# Patient Record
Sex: Female | Born: 1950 | Race: White | Hispanic: No | Marital: Married | Smoking: Never smoker
Health system: Southern US, Community
[De-identification: ages and names within clinical notes are randomized; demographics above are authoritative.]

## PROBLEM LIST (undated history)

## (undated) DIAGNOSIS — I10 Essential (primary) hypertension: Secondary | ICD-10-CM

## (undated) DIAGNOSIS — I4891 Unspecified atrial fibrillation: Secondary | ICD-10-CM

## (undated) DIAGNOSIS — I509 Heart failure, unspecified: Secondary | ICD-10-CM

## (undated) HISTORY — DX: Unspecified atrial fibrillation: I48.91

---

## 2020-02-07 ENCOUNTER — Ambulatory Visit (INDEPENDENT_AMBULATORY_CARE_PROVIDER_SITE_OTHER): Payer: Medicare Other | Admitting: Nurse Practitioner

## 2020-02-07 ENCOUNTER — Other Ambulatory Visit: Payer: Self-pay

## 2020-02-07 ENCOUNTER — Encounter: Payer: Self-pay | Admitting: Nurse Practitioner

## 2020-02-07 DIAGNOSIS — K219 Gastro-esophageal reflux disease without esophagitis: Secondary | ICD-10-CM

## 2020-02-07 DIAGNOSIS — R935 Abnormal findings on diagnostic imaging of other abdominal regions, including retroperitoneum: Secondary | ICD-10-CM

## 2020-02-07 DIAGNOSIS — R1010 Upper abdominal pain, unspecified: Secondary | ICD-10-CM | POA: Diagnosis not present

## 2020-02-07 DIAGNOSIS — R109 Unspecified abdominal pain: Secondary | ICD-10-CM | POA: Insufficient documentation

## 2020-02-07 MED ORDER — PANTOPRAZOLE SODIUM 40 MG PO TBEC: 40.0000 mg | DELAYED_RELEASE_TABLET | Freq: Two times a day (BID) | ORAL | 3 refills | Status: DC

## 2020-02-07 NOTE — Patient Instructions (Signed)
Your health issues we discussed today were:   GERD (reflux/heartburn): 1. I am sorry you are still having the burning in your esophagus feeling 2. Stop taking omeprazole 3. I sent a prescription to your pharmacy for Protonix (pantoprazole) 40 mg twice daily 4. Take this pursing in the morning on an empty stomach and 30 minutes for your last meal the day 5. Let us know if you do not have any improvement on the new medication  Abdominal pain: 1. I feel part of your abdominal symptoms are likely coming from your GERD 2. I will check a HIDA scan to check for gallbladder function to see if "biliary dyskinesia" is causing some of your symptoms 3. Call us if you have any worsening or severe symptoms 4. Somebody from our office or any [facility] radiology will call to schedule your HIDA scan  Previous ultrasound with some abnormal findings: 1. Have your labs checked when you are able to 2. I will put in for an ultrasound of your liver with "elastography" which checks for any scarring of your liver 3. Further recommendations will follow your results  Overall I recommend:  1. We will request your previous records from [facility] 2. Continue your other current medications 3. Return for follow-up in 2 months 4. Call us if you have any questions or concerns   ---------------------------------------------------------------  I am glad you have gotten your COVID-19 vaccination!  Even though you are fully vaccinated you should continue to follow CDC and state/local guidelines.  ---------------------------------------------------------------   At [facility] we value your feedback. You may receive a survey about your visit today. Please share your experience as we strive to create trusting relationships with our patients to provide genuine, compassionate, quality care.  We appreciate your understanding and patience as we review any laboratory  studies, imaging, and other diagnostic tests that are ordered as we care for you. Our office policy is 5 business days for review of these results, and any emergent or urgent results are addressed in a timely manner for your best interest. If you do not hear from our office in 1 week, please contact us.   We also encourage the use of MyChart, which contains your medical information for your review as well. If you are not enrolled in this feature, an access code is on this after visit summary for your convenience. Thank you for allowing [place] to be involved in your care.  It was great to see you today!  I hope you have a great rest of your summer!!

## 2020-02-07 NOTE — Progress Notes (Signed)
Primary Care Physician:  Sanjuana Lettersampbell, Courntey L, FNP Primary Gastroenterologist:  Dr. Marletta Lorarver  Chief Complaint  Patient presents with  . Abdominal Pain    RUQ AND LUQ pain and it radiates down going on for 1 yr. Was treated for ? pulled muscle.   . Gastroesophageal Reflux    c/o nausea. EGD done 11/03/2018 and was diagnosed with hiatal hernia and acid reflux. Had GES done 10/2018 but was normal    HPI:   Pamela Cochran is a 69 y.o. female who presents on referral from primary care for GERD and abdominal pain.  He is making referral including office visit dated 12/12/2019.  At that time noted continued upper abdominal pain under the bilateral breasts with a history of GERD and hernia.  No diarrhea, nausea, constipation.  Typically has postprandial bowel movement but not diarrhea although sometimes nauseous.  Has not had an ultrasound of the abdomen since having her pain.  Has apparently seen GI in the past and had testing done, but unsure where.  Recommended abdominal ultrasound and referral to GI.  Reviewed ultrasound report provided with the referral which is dated 01/08/2020.  Noted heterogenous liver may be related to chronic liver disease without large hypoechoic masses.  Main portal vein demonstrates "to and fro flow" suspicious for portal hypertension.  CBD normal.  Spleen is normal in size.  Today she states she is doing okay overall.  She previously saw GI in [place] (Dr. Vivien RossettiPatrick Kenny at [facility] [place]). She has a history of GERD with an EGD completed 11/03/2018 diagnosed with hiatal hernia and reflux.  Gastric emptying study completed in June 2020 but normal. She followed up with her GI a couple of times and she was told they didn't know what was going on. Has abdominal pain epigastric to RUQ and occasionally LUQ. If she has a bra on it is uncomfortable. Was treated for a pulled muscle without improvement. Was sent to PT with no improvement as well. She is  currently on Prilosec 40 mg daily. She has a lot of belching. Has epigastric burning sensation and a lot of gas. Occasional bitter "funky" taste. Her abdominal pain is worse at night, sometimes postprandial. Avoids trigger foods (pizza, spaghetti, etc). Decreased appetite due to symptoms. Also sometimes has lower abdominal discomfort. Typically has 1-2 bowel movements a day, occasionally will have fewer stools. Denies N/V, hematochezia, melena, fever, chills. Has had unintentional weight loss of about 20 lbs over about 12-18 months; though wasn't eating very much at that time due to her symptoms. Denies URI or flu-like symptoms. Denies loss of sense of taste or smell. The patient has received COVID-19 vaccination(s). Denies chest pain, dyspnea, dizziness, lightheadedness, syncope, near syncope. Denies any other upper or lower GI symptoms.  She has never had a colonoscopy before. Was going to have one, but can't tolerate gallon of prep.  History reviewed. No pertinent past medical history.  History reviewed. No pertinent surgical history.  Current Outpatient Medications  Medication Sig Dispense Refill  . Calcium Citrate (CITRACAL PO) Take by mouth daily.    Marland Kitchen. ELIQUIS 5 MG TABS tablet Take 5 mg by mouth 2 (two) times daily.    Marland Kitchen. lisinopril-hydrochlorothiazide (ZESTORETIC) 20-12.5 MG tablet Take 1 tablet by mouth daily.    Marland Kitchen. LORazepam (ATIVAN) 1 MG tablet Take 1 mg by mouth daily as needed.    . metoprolol tartrate (LOPRESSOR) 25 MG tablet Take 25 mg by mouth 2 (two) times daily.    .Marland Kitchen  Multiple Vitamin (MULTIVITAMIN) tablet Take 1 tablet by mouth daily.    Marland Kitchen venlafaxine XR (EFFEXOR-XR) 37.5 MG 24 hr capsule Take 37.5 mg by mouth daily.    . pantoprazole (PROTONIX) 40 MG tablet Take 1 tablet (40 mg total) by mouth 2 (two) times daily before a meal. 60 tablet 3   No current facility-administered medications for this visit.    Allergies as of 02/07/2020  . (No Known Allergies)    History reviewed.  No pertinent family history.  Social History   Socioeconomic History  . Marital status: Married    Spouse name: Not on file  . Number of children: Not on file  . Years of education: Not on file  . Highest education level: Not on file  Occupational History  . Not on file  Tobacco Use  . Smoking status: Never Smoker  . Smokeless tobacco: Never Used  Substance and Sexual Activity  . Alcohol use: Yes    Comment: occas  . Drug use: Never  . Sexual activity: Not on file  Other Topics Concern  . Not on file  Social History Narrative  . Not on file   Social Determinants of Health   Financial Resource Strain:   . Difficulty of Paying Living Expenses: Not on file  Food Insecurity:   . Worried About Programme researcher, broadcasting/film/video in the Last Year: Not on file  . Ran Out of Food in the Last Year: Not on file  Transportation Needs:   . Lack of Transportation (Medical): Not on file  . Lack of Transportation (Non-Medical): Not on file  Physical Activity:   . Days of Exercise per Week: Not on file  . Minutes of Exercise per Session: Not on file  Stress:   . Feeling of Stress : Not on file  Social Connections:   . Frequency of Communication with Friends and Family: Not on file  . Frequency of Social Gatherings with Friends and Family: Not on file  . Attends Religious Services: Not on file  . Active Member of Clubs or Organizations: Not on file  . Attends Banker Meetings: Not on file  . Marital Status: Not on file  Intimate Partner Violence:   . Fear of Current or Ex-Partner: Not on file  . Emotionally Abused: Not on file  . Physically Abused: Not on file  . Sexually Abused: Not on file    Subjective: Review of Systems  Constitutional: Negative for chills, fever, malaise/fatigue and weight loss.  HENT: Negative for congestion and sore throat.   Respiratory: Negative for cough and shortness of breath.   Cardiovascular: Negative for chest pain and palpitations.    Gastrointestinal: Negative for abdominal pain, blood in stool, diarrhea, melena, nausea and vomiting.  Musculoskeletal: Negative for joint pain and myalgias.  Skin: Negative for rash.  Neurological: Negative for dizziness and weakness.  Endo/Heme/Allergies: Does not bruise/bleed easily.  Psychiatric/Behavioral: Negative for depression. The patient is not nervous/anxious.   All other systems reviewed and are negative.      Objective: BP (!) 168/95   Pulse 100   Temp 97.6 F (36.4 C) (Oral)   Ht 5\' 3"  (1.6 m)   Wt 228 lb 9.6 oz (103.7 kg)   BMI 40.49 kg/m  Physical Exam Vitals and nursing note reviewed.  Constitutional:      General: She is not in acute distress.    Appearance: Normal appearance. She is well-developed. She is not ill-appearing, toxic-appearing or diaphoretic.  HENT:  Head: Normocephalic and atraumatic.     Nose: No congestion or rhinorrhea.  Eyes:     General: No scleral icterus. Cardiovascular:     Rate and Rhythm: Normal rate and regular rhythm.     Heart sounds: Normal heart sounds.  Pulmonary:     Effort: Pulmonary effort is normal. No respiratory distress.     Breath sounds: Normal breath sounds.  Abdominal:     General: Bowel sounds are normal.     Palpations: Abdomen is soft. There is no hepatomegaly, splenomegaly or mass.     Tenderness: There is no abdominal tenderness. There is no guarding or rebound.     Hernia: No hernia is present.  Skin:    General: Skin is warm and dry.     Coloration: Skin is not jaundiced.     Findings: No rash.  Neurological:     General: No focal deficit present.     Mental Status: She is alert and oriented to person, place, and time.  Psychiatric:        Attention and Perception: Attention normal.        Mood and Affect: Mood normal.        Speech: Speech normal.        Behavior: Behavior normal.        Thought Content: Thought content normal.        Cognition and Memory: Cognition and memory normal.       Assessment:  Very pleasant 69 year old female who presents for abdominal pain and previous abdominal ultrasound.  Noted history of GERD with previous EGD and [place], [place] without significant findings other than small hiatal hernia and reflux changes.  Persistent symptoms and GES was ordered which was normal.  She has not seen her GI since.  She felt like they were not trying to figure out what the problem was.  We will request previous records from her GI.  Her new primary care ordered abdominal ultrasound which showed changes in echogenicity of her liver, possible portal vein flow indicative of portal hypertension but normal spleen.  No definitive impression.  GERD: She does still have esophageal burning and bitter taste likely reminiscent of persistent GERD symptoms.  This could be contributing to her abdominal pain as well.  She is on omeprazole once daily.  Her previous GI tried to put her on Dexilant but she could not afford it.  This point I will have her hold omeprazole and start Protonix 40 mg twice daily to see if we get any improvement.  GERD likely a component of her symptoms, though it may not be the main culprit  Abdominal pain: Likely multifactorial in nature.  Her pain is an upper abdomen, although it seems to be focused more in the right upper quadrant per her description.  Right upper quadrant ultrasound without obvious gallstones but did have some abnormal liver changes as noted above.  At this point I will evaluate her for biliary dyskinesia with a HIDA scan.  Further recommendations will follow.  Abnormal previous ultrasound: As outlined above there was some question of changes in echogenicity as well as a possibility of change in portal vein flow indicative of portal hypertension.  Her spleen was normal.  Her platelet count was normal as well.  No definitive impression or findings.  At this point I will repeat a right upper quadrant ultrasound to reevaluate her liver more  definitively and will add on ultrasound elastography of her liver to check for any scarring  changes.  I will also check a CBC and CMP for further evaluation.  Further recommendations will follow   Plan: 1. Request previous GI records from [place], [place] 2. Stop omeprazole 3. Start Protonix 40 mg twice daily 4. HIDA scan 5. Right upper quadrant ultrasound with liver elastography 6. CBC, CMP 7. Return for follow-up in 2 months 8. Call sooner for any worsening symptoms.    Thank you for allowing [place] to participate in the care of Lanier Clam, DNP, AGNP-C Adult & Gerontological Nurse Practitioner [facility]   02/07/2020 4:46 PM   Disclaimer: This note was dictated with voice recognition software. Similar sounding words can inadvertently be transcribed and may not be corrected upon review.

## 2020-02-08 ENCOUNTER — Telehealth: Payer: Self-pay | Admitting: *Deleted

## 2020-02-08 NOTE — Telephone Encounter (Signed)
HIDA scheduled for 9/30, 8:00am, arrival 7:45am, npo midnight, no pain medications after midnight, test takes up to 2 1/5 hrs. RUQ with elastrography scheduled for 9/22, 9:30am arrival 9:15am, npo midnight.  Called pt and she is aware of appt details. She voiced understanding nothing further.

## 2020-02-14 ENCOUNTER — Other Ambulatory Visit: Payer: Self-pay

## 2020-02-14 ENCOUNTER — Ambulatory Visit (HOSPITAL_COMMUNITY)
Admission: RE | Admit: 2020-02-14 | Discharge: 2020-02-14 | Disposition: A | Discharge status: Home / Self Care | Payer: Medicare Other | Source: Ambulatory Visit | Attending: Nurse Practitioner | Admitting: Nurse Practitioner

## 2020-02-14 DIAGNOSIS — R935 Abnormal findings on diagnostic imaging of other abdominal regions, including retroperitoneum: Secondary | ICD-10-CM | POA: Diagnosis present

## 2020-02-14 DIAGNOSIS — R1010 Upper abdominal pain, unspecified: Secondary | ICD-10-CM | POA: Insufficient documentation

## 2020-02-14 DIAGNOSIS — K219 Gastro-esophageal reflux disease without esophagitis: Secondary | ICD-10-CM | POA: Diagnosis present

## 2020-02-15 LAB — COMPREHENSIVE METABOLIC PANEL
AG Ratio: 1.7 (calc) (ref 1.0–2.5)
ALT: 12 U/L (ref 6–29)
AST: 21 U/L (ref 10–35)
Albumin: 4.5 g/dL (ref 3.6–5.1)
Alkaline phosphatase (APISO): 106 U/L (ref 37–153)
BUN: 14 mg/dL (ref 7–25)
CO2: 31 mmol/L (ref 20–32)
Calcium: 9.4 mg/dL (ref 8.6–10.4)
Chloride: 102 mmol/L (ref 98–110)
Creat: 0.8 mg/dL (ref 0.50–0.99)
Globulin: 2.7 g/dL (calc) (ref 1.9–3.7)
Glucose, Bld: 93 mg/dL (ref 65–139)
Potassium: 3.8 mmol/L (ref 3.5–5.3)
Sodium: 141 mmol/L (ref 135–146)
Total Bilirubin: 1.3 mg/dL — ABNORMAL HIGH (ref 0.2–1.2)
Total Protein: 7.2 g/dL (ref 6.1–8.1)

## 2020-02-15 LAB — CBC WITH DIFFERENTIAL/PLATELET
Absolute Monocytes: 568 cells/uL (ref 200–950)
Basophils Absolute: 40 cells/uL (ref 0–200)
Basophils Relative: 0.9 %
Eosinophils Absolute: 150 cells/uL (ref 15–500)
Eosinophils Relative: 3.4 %
HCT: 36.2 % (ref 35.0–45.0)
Hemoglobin: 11.9 g/dL (ref 11.7–15.5)
Lymphs Abs: 1166 cells/uL (ref 850–3900)
MCH: 33.4 pg — ABNORMAL HIGH (ref 27.0–33.0)
MCHC: 32.9 g/dL (ref 32.0–36.0)
MCV: 101.7 fL — ABNORMAL HIGH (ref 80.0–100.0)
MPV: 12 fL (ref 7.5–12.5)
Monocytes Relative: 12.9 %
Neutro Abs: 2477 cells/uL (ref 1500–7800)
Neutrophils Relative %: 56.3 %
Platelets: 166 10*3/uL (ref 140–400)
RBC: 3.56 10*6/uL — ABNORMAL LOW (ref 3.80–5.10)
RDW: 11.6 % (ref 11.0–15.0)
Total Lymphocyte: 26.5 %
WBC: 4.4 10*3/uL (ref 3.8–10.8)

## 2020-02-22 ENCOUNTER — Encounter (HOSPITAL_COMMUNITY): Payer: Self-pay

## 2020-02-22 ENCOUNTER — Ambulatory Visit (HOSPITAL_COMMUNITY)
Admission: RE | Admit: 2020-02-22 | Discharge: 2020-02-22 | Disposition: A | Discharge status: Home / Self Care | Payer: Medicare Other | Source: Ambulatory Visit | Attending: Nurse Practitioner | Admitting: Nurse Practitioner

## 2020-02-22 ENCOUNTER — Other Ambulatory Visit: Payer: Self-pay

## 2020-02-22 DIAGNOSIS — R1010 Upper abdominal pain, unspecified: Secondary | ICD-10-CM | POA: Diagnosis present

## 2020-02-22 DIAGNOSIS — R935 Abnormal findings on diagnostic imaging of other abdominal regions, including retroperitoneum: Secondary | ICD-10-CM | POA: Insufficient documentation

## 2020-02-22 DIAGNOSIS — K219 Gastro-esophageal reflux disease without esophagitis: Secondary | ICD-10-CM | POA: Diagnosis present

## 2020-02-22 HISTORY — DX: Essential (primary) hypertension: I10

## 2020-02-22 HISTORY — DX: Heart failure, unspecified: I50.9

## 2020-02-22 MED ORDER — TECHNETIUM TC 99M MEBROFENIN IV KIT
5.0000 | PACK | Freq: Once | INTRAVENOUS | Status: AC | PRN
  Administered 2020-02-22: 5 via INTRAVENOUS

## 2020-02-26 ENCOUNTER — Telehealth: Payer: Self-pay | Admitting: Internal Medicine

## 2020-02-26 NOTE — Telephone Encounter (Signed)
Informed pt that our provider has not reviewed the results yet but I would be in touch once we have recommendations from our provider.

## 2020-02-26 NOTE — Telephone Encounter (Signed)
PATIENT CALLED ASKING FOR RESULTS FROM HER LABS AND IMAGING

## 2020-03-01 NOTE — Telephone Encounter (Signed)
My chart message sent to patient. See result note for further details.

## 2020-04-10 ENCOUNTER — Encounter: Payer: Self-pay | Admitting: Nurse Practitioner

## 2020-04-10 ENCOUNTER — Other Ambulatory Visit: Payer: Self-pay

## 2020-04-10 ENCOUNTER — Ambulatory Visit (INDEPENDENT_AMBULATORY_CARE_PROVIDER_SITE_OTHER): Payer: Medicare Other | Admitting: Nurse Practitioner

## 2020-04-10 VITALS — BP 152/93 | HR 91 | Temp 96.9°F | Ht 63.0 in | Wt 225.4 lb

## 2020-04-10 DIAGNOSIS — R932 Abnormal findings on diagnostic imaging of liver and biliary tract: Secondary | ICD-10-CM

## 2020-04-10 DIAGNOSIS — K7400 Hepatic fibrosis, unspecified: Secondary | ICD-10-CM | POA: Diagnosis not present

## 2020-04-10 DIAGNOSIS — R1010 Upper abdominal pain, unspecified: Secondary | ICD-10-CM | POA: Diagnosis not present

## 2020-04-10 DIAGNOSIS — K219 Gastro-esophageal reflux disease without esophagitis: Secondary | ICD-10-CM

## 2020-04-10 NOTE — Progress Notes (Signed)
Referring Provider: Narda Rutherford* Primary Care Physician:  William Dalton, FNP Primary GI:  Dr. Abbey Chatters  Chief Complaint  Patient presents with  . Abdominal Pain    under breasts  . belching  . Gastroesophageal Reflux    Protonix bid is helping    HPI:   Pamela Cochran is a 69 y.o. female who presents for follow-up on GERD and abdominal pain.  The patient was last seen in our office 02/07/2020 for upper abdominal pain, GERD, abnormal abdominal ultrasound.  Noted chronic history of GERD and hernia.  Previous ultrasound 01/08/2020 with heterogenous liver due to chronic liver disease without large hypoechoic masses, main portal vein suspicious for portal hypertension.  Normal spleen size.  At her last visit notes she previously saw GI in [place], [place].  Had EGD 11/03/2018 diagnosed with hiatal hernia and reflux.  GES normal in June 2020.  Epigastric to right upper quadrant and occasional left upper quadrant pain, uncomfortable with Abrol.  Treated for a pulled muscle with no improvement, sent to PT with no improvement.  Managed on Prilosec 40 mg daily with a lot of belching and epigastric burning and bitter taste.  Pain worse at night, sometimes postprandial.  She does try to avoid trigger foods.  1-2 bowel movements a day with occasional lower abdominal discomfort.  Unintentional weight loss of 20 pounds over the past 12 to 18 months though was not eating much at that time due to symptoms.  No other overt GI complaints.  Recommended request previous GI records, stop omeprazole and start Protonix 40 mg twice daily, HIDA scan, right upper quadrant ultrasound with liver elastography, CBC, CMP, follow-up in 2 months.  Labs are completed 02/14/2020 with CBC showing no leukocytosis and normal hemoglobin, normal platelet count 166.  CMP with mildly elevated bilirubin at 1.3 (upper limit normal 1.2) and otherwise completely normal.  Right upper quadrant ultrasound found no gallstones,  normal caliber CBD, heterogenous echotexture with no mass or CBD dilation.  Portal vein with normal directional flow.  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   HIDA scan completed 02/22/2020 which found normal EF at 93%.  Today she states she doing okay overall. She has been taking bid Protonix which has helped her GERD symptoms. She was able to eat a small piece of lasagna and it didn't cause a problem, which she was very happy about. Still with some abdominal pain upper abdomen. Still with gas and belching. Pain is intermittent, occurs more at night; variable in intensity. Has a lot of throat dryness sand hoarseness. Denies N/V, hematochezia, melena, fever, chills, unintentional weight loss. She had her COVID-19 booster shot and flu shot. Denies chest pain, dyspnea, dizziness, lightheadedness, syncope, near syncope. Denies any other upper or lower GI symptoms.  States her cardiologist is referring her to another cardiologist (? EP) with appt scheduled for 04/24/20 for tachycardia to discuss possible ablation. These are with [facility] in [place], [place].  Denies personal history of DM or hypercholesterolemia. Has CHF with no history of echo in Epic or CareEverywhere.  Past Medical History:  Diagnosis Date  . Atrial fibrillation ([place])   . CHF (congestive heart failure) ([place])   . Hypertension     History reviewed. No pertinent surgical history.  Current Outpatient Medications  Medication Sig Dispense Refill  . Calcium Citrate (CITRACAL PO) Take by mouth daily.    Marland Kitchen ELIQUIS 5 MG TABS tablet Take 5 mg by mouth 2 (two) times  daily.    . lisinopril-hydrochlorothiazide (ZESTORETIC) 20-12.5 MG tablet Take 1 tablet by mouth daily.    Marland Kitchen LORazepam (ATIVAN) 1 MG tablet Take 1 mg by mouth at bedtime.     . metoprolol tartrate (LOPRESSOR) 25 MG tablet Take 25 mg by mouth 2 (two) times daily.    . Multiple Vitamin (MULTIVITAMIN) tablet Take 1 tablet by mouth daily.    .  pantoprazole (PROTONIX) 40 MG tablet Take 1 tablet (40 mg total) by mouth 2 (two) times daily before a meal. 60 tablet 3  . venlafaxine XR (EFFEXOR-XR) 37.5 MG 24 hr capsule Take 37.5 mg by mouth daily.     No current facility-administered medications for this visit.    Allergies as of 04/10/2020  . (No Known Allergies)    History reviewed. No pertinent family history.  Social History   Socioeconomic History  . Marital status: Married    Spouse name: Not on file  . Number of children: Not on file  . Years of education: Not on file  . Highest education level: Not on file  Occupational History  . Not on file  Tobacco Use  . Smoking status: Never Smoker  . Smokeless tobacco: Never Used  Substance and Sexual Activity  . Alcohol use: Yes    Comment: occas  . Drug use: Never  . Sexual activity: Not on file  Other Topics Concern  . Not on file  Social History Narrative  . Not on file   Social Determinants of Health   Financial Resource Strain:   . Difficulty of Paying Living Expenses: Not on file  Food Insecurity:   . Worried About Charity fundraiser in the Last Year: Not on file  . Ran Out of Food in the Last Year: Not on file  Transportation Needs:   . Lack of Transportation (Medical): Not on file  . Lack of Transportation (Non-Medical): Not on file  Physical Activity:   . Days of Exercise per Week: Not on file  . Minutes of Exercise per Session: Not on file  Stress:   . Feeling of Stress : Not on file  Social Connections:   . Frequency of Communication with Friends and Family: Not on file  . Frequency of Social Gatherings with Friends and Family: Not on file  . Attends Religious Services: Not on file  . Active Member of Clubs or Organizations: Not on file  . Attends Archivist Meetings: Not on file  . Marital Status: Not on file    Subjective: Review of Systems  Constitutional: Negative for chills, fever, malaise/fatigue and weight loss.  HENT:  Negative for congestion and sore throat.   Respiratory: Negative for cough and shortness of breath.   Cardiovascular: Positive for palpitations. Negative for chest pain.  Gastrointestinal: Positive for abdominal pain and heartburn (significantly improved). Negative for blood in stool, constipation, diarrhea, melena, nausea and vomiting.  Musculoskeletal: Negative for joint pain and myalgias.  Skin: Negative for rash.  Neurological: Negative for dizziness and weakness.  Endo/Heme/Allergies: Does not bruise/bleed easily.  Psychiatric/Behavioral: Negative for depression. The patient is not nervous/anxious.   All other systems reviewed and are negative.    Objective: BP (!) 152/93   Pulse 91   Temp (!) 96.9 F (36.1 C) (Temporal)   Ht _0  (1.6 m)   Wt 225 lb 6.4 oz (102.2 kg)   BMI 39.93 kg/m  Physical Exam Vitals and nursing note reviewed.  Constitutional:      General:  She is not in acute distress.    Appearance: Normal appearance. She is well-developed. She is obese. She is not ill-appearing, toxic-appearing or diaphoretic.  HENT:     Head: Normocephalic and atraumatic.     Nose: No congestion or rhinorrhea.  Eyes:     General: No scleral icterus. Cardiovascular:     Rate and Rhythm: Normal rate. Rhythm irregular.     Heart sounds: Normal heart sounds.  Pulmonary:     Effort: Pulmonary effort is normal. No respiratory distress.     Breath sounds: Normal breath sounds.  Abdominal:     General: Bowel sounds are normal.     Palpations: Abdomen is soft. There is no hepatomegaly, splenomegaly or mass.     Tenderness: There is no abdominal tenderness. There is no guarding or rebound.     Hernia: No hernia is present.  Skin:    General: Skin is warm and dry.     Coloration: Skin is not jaundiced.     Findings: No rash.  Neurological:     General: No focal deficit present.     Mental Status: She is alert and oriented to person, place, and time.  Psychiatric:         Attention and Perception: Attention normal.        Mood and Affect: Mood normal.        Speech: Speech normal.        Behavior: Behavior normal.        Thought Content: Thought content normal.        Cognition and Memory: Cognition and memory normal.      Assessment:  Very pleasant six 63-year-old female presents for follow-up on GERD, abdominal pain, liver abnormalities.  Overall the patient is doing somewhat improved.  Her GERD is better on Protonix twice daily.  However, she continues to have upper abdominal pain just under the breast area.  Her right upper quadrant ultrasound elastography, as outlined above, is indicative of liver disease.  She will need further work-up.  GERD: Somewhat improved on Protonix twice a day.  Recommend she continue her current medications.  Notify us of any worsening.  She does have some hoarseness and dry throat which could be "silent GERD" symptoms.  Not sure if her symptoms are completely controlled although she does feel significantly better  Abdominal pain: Upper abdominal pain just under the breast area which is persistent despite improvement in GERD with PPI.  She does have some possible silent GERD symptoms as stated above.  At this point I feel she would be best served by an upper endoscopy to further evaluate.  She also has possible liver disease which will allow for variceal screening as well.  She would likely benefit from an H. pylori biopsy.  Liver abnormalities: Abnormal liver imaging as stated above with elastography greater than 13 kPA which is highly suggestive of cACLD.  No previous liver work-up.  At this point we will proceed with a full liver work-up including viral, autoimmune, metabolic.  Given her body habitus it is most likely fatty liver disease, although we will rule out other possibilities.  She does deny a personal history of diabetes or hypercholesterolemia, but she does have hypertension.   Proceed with EGD with Dr. Abbey Chatters on  propofol/MAC in near future: the risks, benefits, and alternatives have been discussed with the patient in detail. The patient states understanding and desires to proceed.  ASA III (CHF, AFib)  She is currently on Eliquis and  will need to discuss with cardiology about holding for 48 hours.   Plan: 1. CMP, INR, viral hepatitis serologies, ANA, ASMA, AMA, ferritin, ceruloplasmin 2. EGD with Dr. Abbey Chatters 3. Continue Protonix 40 mg twice daily 4. Follow-up in 3 months 5. Call for worsening symptoms    Thank you for allowing [place] to participate in the care of Claria Dice, DNP, AGNP-C Adult & Gerontological Nurse Practitioner [facility]   04/10/2020 3:24 PM   Disclaimer: This note was dictated with voice recognition software. Similar sounding words can inadvertently be transcribed and may not be corrected upon review.

## 2020-04-10 NOTE — Patient Instructions (Signed)
Your health issues we discussed today were:   GERD (reflux/heartburn): 1. Glad you are overall feeling better on Protonix twice daily 2. Continue to take Protonix twice daily 3. Call us if you have any worsening or severe symptoms  Abdominal pain: 1. Because of your persistent abdominal pain although your GERD is better controlled we will plan for an upper endoscopy 2. We will need to get clearance from your cardiologist to hold your Eliquis blood thinner for 48 hours prior to your procedure 3. Call us if you have any worsening or severe symptoms  Abnormal liver ultrasound: 1. I have ordered a bunch of labs to evaluate your liver for possible changes for causes of liver scarring 2. Have these completed ECG can 3. We will call you when get the results 4. Call us if you have any concerning symptoms  Overall I recommend:  1. Continue other current medications 2. Return for follow-up in 3 months 3. Call us for any questions or concerns   ---------------------------------------------------------------  I am glad you have gotten your COVID-19 vaccination!  Even though you are fully vaccinated you should continue to follow CDC and state/local guidelines.  ---------------------------------------------------------------   At [facility] we value your feedback. You may receive a survey about your visit today. Please share your experience as we strive to create trusting relationships with our patients to provide genuine, compassionate, quality care.  We appreciate your understanding and patience as we review any laboratory studies, imaging, and other diagnostic tests that are ordered as we care for you. Our office policy is 5 business days for review of these results, and any emergent or urgent results are addressed in a timely manner for your best interest. If you do not hear from our office in 1 week, please contact us.   We also encourage the use of MyChart, which contains  your medical information for your review as well. If you are not enrolled in this feature, an access code is on this after visit summary for your convenience. Thank you for allowing [place] to be involved in your care.  It was great to see you today!  I hope you have a Happy Thanksgiving!!

## 2020-04-11 ENCOUNTER — Telehealth: Payer: Self-pay

## 2020-04-11 ENCOUNTER — Encounter: Payer: Self-pay | Admitting: Internal Medicine

## 2020-04-11 ENCOUNTER — Telehealth: Payer: Self-pay | Admitting: Internal Medicine

## 2020-04-11 NOTE — Telephone Encounter (Signed)
Letter faxed to pts cardiologist.

## 2020-04-11 NOTE — Telephone Encounter (Signed)
Returning call. [phone]

## 2020-04-11 NOTE — Telephone Encounter (Signed)
Spoke with pt. Pts cardiologist is Dr. Nuala Alpha [facility] [phone], fax [phone]).

## 2020-04-11 NOTE — Telephone Encounter (Signed)
Lmom, waiting on a return call. I would like to confirm pts cardiologist so we can get the ok to hold Eliquis 48 hours prior to procedure.

## 2020-04-14 LAB — HEPATITIS B SURFACE ANTIGEN: Hepatitis B Surface Ag: NONREACTIVE

## 2020-04-14 LAB — COMPREHENSIVE METABOLIC PANEL
AG Ratio: 1.6 (calc) (ref 1.0–2.5)
ALT: 11 U/L (ref 6–29)
AST: 19 U/L (ref 10–35)
Albumin: 4.6 g/dL (ref 3.6–5.1)
Alkaline phosphatase (APISO): 103 U/L (ref 37–153)
BUN: 15 mg/dL (ref 7–25)
CO2: 30 mmol/L (ref 20–32)
Calcium: 9.9 mg/dL (ref 8.6–10.4)
Chloride: 101 mmol/L (ref 98–110)
Creat: 0.81 mg/dL (ref 0.50–0.99)
Globulin: 2.8 g/dL (calc) (ref 1.9–3.7)
Glucose, Bld: 91 mg/dL (ref 65–139)
Potassium: 3.9 mmol/L (ref 3.5–5.3)
Sodium: 140 mmol/L (ref 135–146)
Total Bilirubin: 0.9 mg/dL (ref 0.2–1.2)
Total Protein: 7.4 g/dL (ref 6.1–8.1)

## 2020-04-14 LAB — ANTI-SMOOTH MUSCLE ANTIBODY, IGG: Actin (Smooth Muscle) Antibody (IGG): <20 U (ref <20)

## 2020-04-14 LAB — MITOCHONDRIAL ANTIBODIES: Mitochondrial M2 Ab, IgG: <=20 U

## 2020-04-14 LAB — ANA: Anti Nuclear Antibody (ANA): NEGATIVE

## 2020-04-14 LAB — HEPATITIS C ANTIBODY
Hepatitis C Ab: NONREACTIVE
SIGNAL TO CUT-OFF: 0.01 (ref <1.00)

## 2020-04-14 LAB — CERULOPLASMIN: Ceruloplasmin: 36 mg/dL (ref 18–53)

## 2020-04-14 LAB — FERRITIN: Ferritin: 25 ng/mL (ref 16–288)

## 2020-04-14 LAB — HEPATITIS B CORE ANTIBODY, TOTAL: Hep B Core Total Ab: NONREACTIVE

## 2020-04-14 LAB — PROTIME-INR
INR: 1.1
Prothrombin Time: 11.5 s (ref 9.0–11.5)

## 2020-04-14 LAB — HEPATITIS B SURFACE ANTIBODY,QUALITATIVE: Hep B S Ab: NONREACTIVE

## 2020-04-14 LAB — HEPATITIS A ANTIBODY, TOTAL: Hepatitis A AB,Total: NONREACTIVE

## 2020-05-29 NOTE — Telephone Encounter (Signed)
Helmut Muster please advise regarding clearance, thanks

## 2020-05-30 NOTE — Telephone Encounter (Signed)
Called pt to schedule procedure. She states she has an appt 2/9 and wants to wait until she comes in to get scheduled. She states she has to go see a heart doctor today.

## 2020-05-30 NOTE — Telephone Encounter (Signed)
Spoke with nurse Victorino Dike at Dr. Carolann Littler office. The letter previously faxed was received and ok per Dr. Rogers Seeds for pt to hold Eliquis 48 hours prior to her procedure. Dr. Saunders Revel office apologizes for the approval letter not being faxed back to our office.

## 2020-06-06 ENCOUNTER — Other Ambulatory Visit: Payer: Self-pay | Admitting: Nurse Practitioner

## 2020-06-06 DIAGNOSIS — R935 Abnormal findings on diagnostic imaging of other abdominal regions, including retroperitoneum: Secondary | ICD-10-CM

## 2020-06-06 DIAGNOSIS — R1010 Upper abdominal pain, unspecified: Secondary | ICD-10-CM

## 2020-06-06 DIAGNOSIS — K219 Gastro-esophageal reflux disease without esophagitis: Secondary | ICD-10-CM

## 2020-07-03 ENCOUNTER — Ambulatory Visit: Payer: Medicare Other | Admitting: Internal Medicine

## 2020-11-05 ENCOUNTER — Other Ambulatory Visit: Payer: Self-pay | Admitting: Internal Medicine

## 2020-11-05 DIAGNOSIS — I480 Paroxysmal atrial fibrillation: Secondary | ICD-10-CM

## 2020-11-05 DIAGNOSIS — R6520 Severe sepsis without septic shock: Secondary | ICD-10-CM

## 2020-11-05 DIAGNOSIS — Z930 Tracheostomy status: Secondary | ICD-10-CM

## 2020-11-05 DIAGNOSIS — J9621 Acute and chronic respiratory failure with hypoxia: Secondary | ICD-10-CM

## 2020-11-06 ENCOUNTER — Other Ambulatory Visit: Payer: Self-pay | Admitting: Internal Medicine

## 2020-11-06 DIAGNOSIS — J9621 Acute and chronic respiratory failure with hypoxia: Secondary | ICD-10-CM

## 2020-11-06 DIAGNOSIS — I480 Paroxysmal atrial fibrillation: Secondary | ICD-10-CM

## 2020-11-06 DIAGNOSIS — Z930 Tracheostomy status: Secondary | ICD-10-CM

## 2020-11-06 DIAGNOSIS — R6520 Severe sepsis without septic shock: Secondary | ICD-10-CM

## 2020-11-06 NOTE — Progress Notes (Signed)
[place]  Date of Service: 11/05/2020  PULMONARY CONSULT   Pamela Cochran  ZJI:[id number]  DOB: 24-Mar-1951     Referring Physician: Deanne Coffer, MD  HPI: Pamela Cochran is a 70 y.o. female seen for Acute on Chronic Respiratory Failure.  Patient has multiple medical issues transferred to our facility for further management and weaning.  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  Postoperatively had a long drawn out course not able to come off the ventilator.  Patient required aggressive diuresis also required ECMO patient had also developed sepsis for which she was treated with meropenem.  Subsequently was not able to come off of the ventilator and ended up having to have a tracheostomy done.  Patient is now transferred to our facility for further management.  At this time is comfortable without any distress is on 28% FiO2  Review of Systems:  ROS performed and is unremarkable other than noted above.  Past Medical History:  Diagnosis Date   Atrial fibrillation ([place])    CHF (congestive heart failure) ([place])    Hypertension     No past surgical history on file.  Social History:    reports that she has never smoked. She has never used smokeless tobacco. She reports current alcohol use. She reports that she does not use drugs.  Family History: Non-Contributory to the present illness  Allergies  Reviewed on the [facility]  Medications: Reviewed on Rounds  Physical Exam:  Vitals: Temperature is 98.5 pulse 70 respiratory 22 blood pressure is 123/58 saturations 98%  Ventilator Settings off the ventilator right now on T collar FiO2 28%  General: Comfortable at this time Eyes: Grossly normal lids, irises & conjunctiva ENT: grossly tongue is normal Neck: no obvious mass Cardiovascular: S1-S2 normal no gallop or rubs noted Respiratory: Scattered rhonchi  expansion is equal Abdomen: Soft and nontender Skin: no rash seen on limited exam Musculoskeletal: not rigid Psychiatric:unable to assess Neurologic: no seizure no involuntary movements         Labs on Admission:  Comprehensive Metabolic Panel (CMP) Specimen:  Blood  Ref Range & Units 1 d ago Comments  Sodium 135 - 145 mmol/L 144    Potassium 3.5 - 5.0 mmol/L 3.4 Low     Chloride 98 - 108 mmol/L 104    Carbon Dioxide (CO2) 21 - 30 mmol/L 30    Urea Nitrogen (BUN) 7 - 20 mg/dL 34 High     Creatinine 0.4 - 1.0 mg/dL 0.7    Glucose 70 - 140 mg/dL 120  Interpretive Data:  Above is the NONFASTING reference range.   Below are the FASTING reference ranges:  NORMAL:      70-99 mg/dL  PREDIABETES: 100-125 mg/dL  DIABETES:    > 125 mg/dL   Calcium 8.7 - 10.2 mg/dL 8.7    AST (Aspartate Aminotransferase) 15 - 41 U/L 45 High     ALT (Alanine Aminotransferase) 14 - 54 U/L 52    Bilirubin, Total 0.4 - 1.5 mg/dL 1.4    Alk Phos (Alkaline Phosphatase) 24 - 110 U/L 112 High     Albumin 3.5 - 4.8 g/dL 2.8 Low     Protein, Total 6.2 - 8.1 g/dL 6.8    Anion Gap 3 - 12 mmol/L 10    BUN/CREA Ratio 6 - 27 47 High     Glomerular Filtration Rate (eGFR)  mL/min/1.73sq m  94      Complete Blood Count (CBC) Specimen:  Blood  Ref Range & Units 1 d ago  WBC (White Blood Cell Count) 3.2 - 9.8 x10^9/L 10.0 High    Hemoglobin 12.0 - 15.5 g/dL 8.1 Low    Hematocrit 35.0 - 45.0 % 26.5 Low    Platelets 150 - 450 x10^9/L 221   MCV (Mean Corpuscular Volume) 80 - 98 fL 107 High    MCH (Mean Corpuscular Hemoglobin) 26.5 - 34.0 pg 32.7   MCHC (Mean Corpuscular Hemoglobin Concentration) 31.4 - 36.0 % 30.6 Low    RBC (Red Blood Cell Count) 3.77 - 5.16 x10^12/L 2.48 Low    RDW-CV (Red Cell Distribution Width) 11.5 - 14.5 % 19.2 High    NRBC (Nucleated Red Blood Cell Count) 0 x10^9/L 0.00   NRBC % (Nucleated Red Blood Cell %) % 0.0   MPV (Mean Platelet Volume) 7.2 - 11.7 fL 14.0 High    Resulting Agency  [facility]  CLINICAL LABORATORY  Specimen Collected: 11/05/20 03:30 Last Resulted: 11/05/20 04:08     Radiological Exams on Admission: No chest x-rays noted at this time  Assessment/Plan Patient Active Problem List   Diagnosis Date Noted   Acute on chronic respiratory failure with hypoxia (HCC) 11/06/2020   Severe sepsis (HCC) 11/06/2020   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 Paroxysmal atrial fibrillation rate is controlled at this time we will continue to follow along closely. Tracheostomy status right now on patient is requiring T-bar hopefully will be able to try to advance the weaning gradually.  I will plan on doing PMV and changing the trach out Severe sepsis patient has been treated in resolution from acute infection we will continue to monitor closely  I have personally seen and evaluated the patient, evaluated laboratory and imaging results, formulated the assessment and plan and placed orders. The Patient requires high complexity decision making with multiple systems involvement.  Case was discussed on Rounds with the Respiratory Therapy Staff Time Spent 14mnutes  SAllyne Gee MD [facility]

## 2020-11-07 ENCOUNTER — Other Ambulatory Visit: Payer: Self-pay | Admitting: Internal Medicine

## 2020-11-07 DIAGNOSIS — I480 Paroxysmal atrial fibrillation: Secondary | ICD-10-CM

## 2020-11-07 DIAGNOSIS — J9621 Acute and chronic respiratory failure with hypoxia: Secondary | ICD-10-CM

## 2020-11-07 DIAGNOSIS — R6520 Severe sepsis without septic shock: Secondary | ICD-10-CM

## 2020-11-07 DIAGNOSIS — Z930 Tracheostomy status: Secondary | ICD-10-CM

## 2020-11-07 NOTE — Progress Notes (Signed)
[facility]  PROGRESS NOTE  PULMONARY SERVICE ROUNDS   Pamela Cochran  DOB: 08-01-1950  Referring physician: Larena Glassman, MD  HPI: Pamela Cochran is a 70 y.o. female  being seen for Acute on Chronic Respiratory Failure.  Patient currently is on T collar using the PMV we will try to continue to build on that.  Review of Systems: Unremarkable other than noted in HPI  Allergies:  Reviewed on the [facility]  Medications: Reviewed  Vitals: Temperature is 98.2 pulse 76 respiratory rate is 18 blood pressure is 127/56 saturations 99%  Ventilator Settings: Off ventilator on T collar FiO2 28%  Physical Exam: General:  calm and comfortable NAD Eyes: normal lids, irises & conjunctiva ENT: grossly normal tongue not enlarged Neck: no masses Cardiovascular: S1 S2 Normal no rubs no gallop Respiratory: No rhonchi very coarse breath sounds Abdomen: soft non-distended Skin: no rash seen on limited exam Musculoskeletal:  no rigidity Psychiatric: unable to assess Neurologic: no involuntary movements          Lab Data and radiological Data:  Data reviewed   Assessment/Plan  Patient Active Problem List   Diagnosis Date Noted   Acute on chronic respiratory failure with hypoxia (HCC) 11/06/2020   Severe sepsis (HCC) 11/06/2020   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 The Patient requires high  complexity decision making with multiple system involvement. Rounds were done with the Respiratory Therapy Director and respiratory therapist involved in the care of the patient as well as nursing staff.   Yevonne Pax, MD [facility] Pulmonary Critical Care Medicine   This note is for inpatient care

## 2020-11-08 ENCOUNTER — Other Ambulatory Visit: Payer: Self-pay | Admitting: Internal Medicine

## 2020-11-08 DIAGNOSIS — Z930 Tracheostomy status: Secondary | ICD-10-CM

## 2020-11-08 DIAGNOSIS — R6520 Severe sepsis without septic shock: Secondary | ICD-10-CM

## 2020-11-08 DIAGNOSIS — J9621 Acute and chronic respiratory failure with hypoxia: Secondary | ICD-10-CM

## 2020-11-08 DIAGNOSIS — I480 Paroxysmal atrial fibrillation: Secondary | ICD-10-CM

## 2020-11-09 NOTE — Progress Notes (Signed)
[facility]  PROGRESS NOTE  PULMONARY SERVICE ROUNDS   Pamela Cochran  DOB: [birth year]  Referring physician: Larena Glassman, MD  HPI: Pamela Cochran is a 70 y.o. female  being seen for Acute on Chronic Respiratory Failure.  Patient is comfortable right now on T collar ready for trach change  Review of Systems: Unremarkable other than noted in HPI  Allergies:  Reviewed on the [facility]  Medications: Reviewed  Vitals: Temperature is 98.7 pulse is 70 respiratory rate 20 saturations 100%  Ventilator Settings: Off the ventilator on T collar  Physical Exam: General:  calm and comfortable NAD Eyes: normal lids, irises & conjunctiva ENT: grossly normal tongue not enlarged Neck: no masses Cardiovascular: S1 S2 Normal no rubs no gallop Respiratory: No rhonchi coarse breath sounds Abdomen: soft non-distended Skin: no rash seen on limited exam Musculoskeletal:  no rigidity Psychiatric: unable to assess Neurologic: no involuntary movements          Lab Data and radiological Data:  Reviewed   Assessment/Plan  Patient Active Problem List   Diagnosis Date Noted   Acute on chronic respiratory failure with hypoxia (HCC) 11/06/2020   Severe sepsis (HCC) 11/06/2020   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 Severe sepsis treated with resolution we will continue to monitor Paroxysmal atrial fibrillation rate controlled at this time Tracheostomy remains in place we will continue to follow   I have personally evaluated the patient, evaluated the laboratory and imaging results and formulated the assessment and plan and placed orders as needed. The Patient requires high complexity decision making with multiple system involvement. Rounds were done with the Respiratory Therapy Director and respiratory  therapist involved in the care of the patient as well as nursing staff.   Yevonne Pax, MD [facility] Pulmonary Critical Care Medicine   This note is for inpatient care

## 2020-11-09 NOTE — Progress Notes (Signed)
[facility]  PROGRESS NOTE  PULMONARY SERVICE ROUNDS   Siedah Sedor  DOB: 26-Jun-1950  Referring physician: Larena Glassman, MD  HPI: Pamela Cochran is a 70 y.o. female  being seen for Acute on Chronic Respiratory Failure.  Patient currently is on the ventilator with should be able to start PMV  Review of Systems: Unremarkable other than noted in HPI  Allergies:  Reviewed on the [facility]  Medications: Reviewed  Vitals: Temperature is 98.6 pulse 79 respiratory is 18 blood pressure is 131/57 saturations 99%  Ventilator Settings: On T collar with an FiO2 of 28%  Physical Exam: General:  calm and comfortable NAD Eyes: normal lids, irises & conjunctiva ENT: grossly normal tongue not enlarged Neck: no masses Cardiovascular: S1 S2 Normal no rubs no gallop Respiratory: No rhonchi very coarse breath sounds Abdomen: soft non-distended Skin: no rash seen on limited exam Musculoskeletal:  no rigidity Psychiatric: unable to assess Neurologic: no involuntary movements          Lab Data and radiological Data:  Labs have been reviewed   Assessment/Plan  Patient Active Problem List   Diagnosis Date Noted   Acute on chronic respiratory failure with hypoxia (HCC) 11/06/2020   Severe sepsis (HCC) 11/06/2020   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 The Patient requires high complexity decision making with multiple system  involvement. Rounds were done with the Respiratory Therapy Director and respiratory therapist involved in the care of the patient as well as nursing staff.   Yevonne Pax, MD [facility] Pulmonary Critical Care Medicine   This note is for inpatient care

## 2020-11-11 ENCOUNTER — Other Ambulatory Visit: Payer: Self-pay | Admitting: Internal Medicine

## 2020-11-11 DIAGNOSIS — A419 Sepsis, unspecified organism: Secondary | ICD-10-CM

## 2020-11-11 DIAGNOSIS — Z930 Tracheostomy status: Secondary | ICD-10-CM

## 2020-11-11 DIAGNOSIS — R6520 Severe sepsis without septic shock: Secondary | ICD-10-CM

## 2020-11-11 DIAGNOSIS — I480 Paroxysmal atrial fibrillation: Secondary | ICD-10-CM

## 2020-11-11 DIAGNOSIS — J9621 Acute and chronic respiratory failure with hypoxia: Secondary | ICD-10-CM

## 2020-11-11 NOTE — Progress Notes (Signed)
[facility]  PROGRESS NOTE  PULMONARY SERVICE ROUNDS   Pamela Cochran  DOB: 1950/06/30  Referring physician: Larena Glassman, MD  HPI: Pamela Cochran is a 70 y.o. female  being seen for Acute on Chronic Respiratory Failure.  Patient is afebrile right now resting comfortably without any distress.  Has been on T collar using PMV  Review of Systems: Unremarkable other than noted in HPI  Allergies:  Reviewed on the [facility]  Medications: Reviewed  Vitals: Temperature is 96.8 pulse 77 respiratory to 16 blood pressure 150/49 saturations 100%  Ventilator Settings: On T collar 28% FiO2 with PMV  Physical Exam: General:  calm and comfortable NAD Eyes: normal lids, irises & conjunctiva ENT: grossly normal tongue not enlarged Neck: no masses Cardiovascular: S1 S2 Normal no rubs no gallop Respiratory: Scattered rhonchi expansion is equal Abdomen: soft non-distended Skin: no rash seen on limited exam Musculoskeletal:  no rigidity Psychiatric: unable to assess Neurologic: no involuntary movements          Lab Data and radiological Data:  Labs reviewed   Assessment/Plan  Patient Active Problem List   Diagnosis Date Noted   Acute on chronic respiratory failure with hypoxia (HCC) 11/06/2020   Severe sepsis (HCC) 11/06/2020   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 Severe sepsis treated resolved we will continue to monitor closely Tracheostomy remains in place Atrial fibrillation rate is controlled at this time   I have personally evaluated the patient, evaluated the laboratory and imaging results and formulated the assessment and plan and placed orders as needed. The Patient requires high complexity decision making with multiple system involvement. Rounds were done with the  Respiratory Therapy Director and respiratory therapist involved in the care of the patient as well as nursing staff.   Yevonne Pax, MD [facility] Pulmonary Critical Care Medicine   This note is for inpatient care

## 2020-11-12 ENCOUNTER — Other Ambulatory Visit: Payer: Self-pay | Admitting: Internal Medicine

## 2020-11-12 DIAGNOSIS — Z930 Tracheostomy status: Secondary | ICD-10-CM

## 2020-11-12 DIAGNOSIS — R6520 Severe sepsis without septic shock: Secondary | ICD-10-CM

## 2020-11-12 DIAGNOSIS — A419 Sepsis, unspecified organism: Secondary | ICD-10-CM

## 2020-11-12 DIAGNOSIS — J9621 Acute and chronic respiratory failure with hypoxia: Secondary | ICD-10-CM

## 2020-11-12 DIAGNOSIS — I480 Paroxysmal atrial fibrillation: Secondary | ICD-10-CM

## 2020-11-13 ENCOUNTER — Other Ambulatory Visit: Payer: Self-pay | Admitting: Internal Medicine

## 2020-11-13 DIAGNOSIS — Z930 Tracheostomy status: Secondary | ICD-10-CM

## 2020-11-13 DIAGNOSIS — J9621 Acute and chronic respiratory failure with hypoxia: Secondary | ICD-10-CM

## 2020-11-13 DIAGNOSIS — R6520 Severe sepsis without septic shock: Secondary | ICD-10-CM

## 2020-11-13 DIAGNOSIS — A419 Sepsis, unspecified organism: Secondary | ICD-10-CM

## 2020-11-13 DIAGNOSIS — I480 Paroxysmal atrial fibrillation: Secondary | ICD-10-CM

## 2020-11-13 NOTE — Progress Notes (Signed)
[facility]  PROGRESS NOTE  PULMONARY SERVICE ROUNDS   Pamela Cochran  DOB: [birth year]  Referring physician: Larena Glassman, MD  HPI: Pamela Cochran is a 70 y.o. female  being seen for Acute on Chronic Respiratory Failure.  Patient is resting comfortably right now without distress at this time  Review of Systems: Unremarkable other than noted in HPI  Allergies:  Reviewed on the [facility]  Medications: Reviewed  Vitals: Temperature is 97.2 pulse 80 respiratory rate is 18 blood pressure is 133/75 saturations 97%  Ventilator Settings: Currently is doing fine with T collar trials  Physical Exam: General:  calm and comfortable NAD Eyes: normal lids, irises & conjunctiva ENT: grossly normal tongue not enlarged Neck: no masses Cardiovascular: S1 S2 Normal no rubs no gallop Respiratory: No rhonchi or rales are noted at this time. 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 Severe sepsis treated in resolution we will continue to monitor Tracheostomy remains in place Paroxysmal atrial fibrillation rate is controlled we will continue to follow along closely   I have personally evaluated the patient, evaluated the laboratory and imaging results and formulated the assessment and plan and placed orders as needed. The Patient requires high complexity decision  making with multiple system involvement. Rounds were done with the Respiratory Therapy Director and respiratory therapist involved in the care of the patient as well as nursing staff.   Yevonne Pax, MD [facility] Pulmonary Critical Care Medicine   This note is for inpatient care

## 2020-11-13 NOTE — Progress Notes (Signed)
[facility]  PROGRESS NOTE  PULMONARY SERVICE ROUNDS   Pamela Cochran  DOB: 12-30-1950  Referring physician: Larena Glassman, MD  HPI: Pamela Cochran is a 70 y.o. female  being seen for Acute on Chronic Respiratory Failure.  Patient is resting comfortably right now without distress has been capping doing well on 2 L of oxygen  Review of Systems: Unremarkable other than noted in HPI  Allergies:  Reviewed on the [facility]  Medications: Reviewed  Vitals: Temperature is 98.6 pulse 68 respiratory to 16 blood pressure is 119/60 saturations 100%  Ventilator Settings: Capping off the ventilator  Physical Exam: General:  calm and comfortable NAD Eyes: normal lids, irises & conjunctiva ENT: grossly normal tongue not enlarged Neck: no masses Cardiovascular: S1 S2 Normal no rubs no gallop Respiratory: No rhonchi or rales are noted at this time Abdomen: soft non-distended Skin: no rash seen on limited exam Musculoskeletal:  no rigidity Psychiatric: unable to assess Neurologic: no involuntary movements          Lab Data and radiological Data:  Data has been reviewed   Assessment/Plan  Patient Active Problem List   Diagnosis Date Noted   Acute on chronic respiratory failure with hypoxia (HCC) 11/06/2020   Severe sepsis (HCC) 11/06/2020   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 The Patient requires high  complexity decision making with multiple system involvement. Rounds were done with the Respiratory Therapy Director and respiratory therapist involved in the care of the patient as well as nursing staff.   Yevonne Pax, MD [facility] Pulmonary Critical Care Medicine   This note is for inpatient care

## 2020-11-14 ENCOUNTER — Other Ambulatory Visit: Payer: Self-pay | Admitting: Internal Medicine

## 2020-11-14 DIAGNOSIS — I480 Paroxysmal atrial fibrillation: Secondary | ICD-10-CM

## 2020-11-14 DIAGNOSIS — R6520 Severe sepsis without septic shock: Secondary | ICD-10-CM

## 2020-11-14 DIAGNOSIS — J9621 Acute and chronic respiratory failure with hypoxia: Secondary | ICD-10-CM

## 2020-11-14 DIAGNOSIS — A419 Sepsis, unspecified organism: Secondary | ICD-10-CM

## 2020-11-14 DIAGNOSIS — Z930 Tracheostomy status: Secondary | ICD-10-CM

## 2020-11-16 NOTE — Progress Notes (Signed)
[facility]  PROGRESS NOTE  PULMONARY SERVICE ROUNDS   Madine Sarr  DOB: 14-May-1951  Referring physician: Larena Glassman, MD  HPI: Pamela Cochran is a 70 y.o. female  being seen for Acute on Chronic Respiratory Failure.  Patient is resting comfortably right now without distress has been capping has a good strong cough noted  Review of Systems: Unremarkable other than noted in HPI  Allergies:  Reviewed on the [facility]  Medications: Reviewed  Vitals: The temperature is 98.2 pulse 78 respiratory rate is 18 blood pressure is 129/60 saturations 97%  Ventilator Settings: Capping of the ventilator at this time.  Physical Exam: General:  calm and comfortable NAD Eyes: normal lids, irises & conjunctiva ENT: grossly normal tongue not enlarged Neck: no masses Cardiovascular: S1 S2 Normal no rubs no gallop Respiratory: Scattered rhonchi expansion is equal Abdomen: soft non-distended Skin: no rash seen on limited exam Musculoskeletal:  no rigidity Psychiatric: unable to assess Neurologic: no involuntary movements          Lab Data and radiological Data:  Labs have been reviewed   Assessment/Plan  Patient Active Problem List   Diagnosis Date Noted   Acute on chronic respiratory failure with hypoxia (HCC) 11/06/2020   Severe sepsis (HCC) 11/06/2020   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 Tracheostomy remains in place we will continue to follow along closely Severe sepsis treated in resolution Atrial fibrillation rate is controlled at this time continue to follow along   I have personally evaluated the patient, evaluated the laboratory and imaging results and formulated the assessment and plan and placed orders as needed. The Patient requires high complexity  decision making with multiple system involvement. Rounds were done with the Respiratory Therapy Director and respiratory therapist involved in the care of the patient as well as nursing staff.   Yevonne Pax, MD [facility] Pulmonary Critical Care Medicine   This note is for inpatient care

## 2020-11-19 ENCOUNTER — Other Ambulatory Visit: Payer: Self-pay | Admitting: Internal Medicine

## 2020-11-19 DIAGNOSIS — R6520 Severe sepsis without septic shock: Secondary | ICD-10-CM

## 2020-11-19 DIAGNOSIS — Z930 Tracheostomy status: Secondary | ICD-10-CM

## 2020-11-19 DIAGNOSIS — I480 Paroxysmal atrial fibrillation: Secondary | ICD-10-CM

## 2020-11-19 DIAGNOSIS — J9621 Acute and chronic respiratory failure with hypoxia: Secondary | ICD-10-CM

## 2020-11-20 NOTE — Progress Notes (Signed)
[facility]  PROGRESS NOTE  PULMONARY SERVICE ROUNDS   Pamela Cochran  DOB: 18-May-1951  Referring physician: Larena Glassman, MD  HPI: Pamela Cochran is a 70 y.o. female  being seen for Acute on Chronic Respiratory Failure.  Patient is on room air has been capping without distress  Review of Systems: Unremarkable other than noted in HPI  Allergies:  Reviewed on the [facility]  Medications: Reviewed  Vitals: Temperature is 98.2 pulse 82 respiratory rate is 19 blood pressure is 131/64 saturations 95%  Ventilator Settings: Capping on room air  Physical Exam: General:  calm and comfortable NAD Eyes: normal lids, irises & conjunctiva ENT: grossly normal tongue not enlarged Neck: no masses Cardiovascular: S1 S2 Normal no rubs no gallop Respiratory: No rhonchi very coarse breath Abdomen: soft non-distended Skin: no rash seen on limited exam Musculoskeletal:  no rigidity Psychiatric: unable to assess Neurologic: no involuntary movements          Lab Data and radiological Data:  Labs have been reviewed   Assessment/Plan  Patient Active Problem List   Diagnosis Date Noted   Acute on chronic respiratory failure with hypoxia (HCC) 11/06/2020   Severe sepsis (HCC) 11/06/2020   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 The Patient requires high complexity decision making with multiple system involvement. Rounds were done with the Respiratory Therapy Director and respiratory  therapist involved in the care of the patient as well as nursing staff.   Yevonne Pax, MD [facility] Pulmonary Critical Care Medicine   This note is for inpatient care

## 2022-08-11 IMAGING — US US ABDOMEN LIMITED
1 series · 15 of 25 positions shown · non-contrast
Comparison: None.
COMPARISON: None.

Addendum:
CLINICAL DATA: Upper abdominal pain.

EXAM:
US ABDOMEN LIMITED - RIGHT UPPER QUADRANT
ULTRASOUND HEPATIC ELASTOGRAPHY
TECHNIQUE: Sonography of the right upper quadrant was performed. In addition,
ultrasound elastography evaluation of the liver was performed. A
region of interest was placed within the right lobe of the liver.
Following application of a compressive sonographic pulse, tissue
compressibility was assessed. Multiple assessments were performed at
the selected site. Median tissue compressibility was determined.
Previously, hepatic stiffness was assessed by shear wave velocity.
Based on recently published Society of Radiologists in Ultrasound
consensus article, reporting is now recommended to be performed in
the SI units of pressure (kiloPascals) representing hepatic
stiffness/elasticity. The obtained result is compared to the
published reference standards. (cACLD = compensated Advanced Chronic
Liver Disease)

[Series 1: us abdomen limited ruq · 15 of 58 slices shown]
[im 1/58]
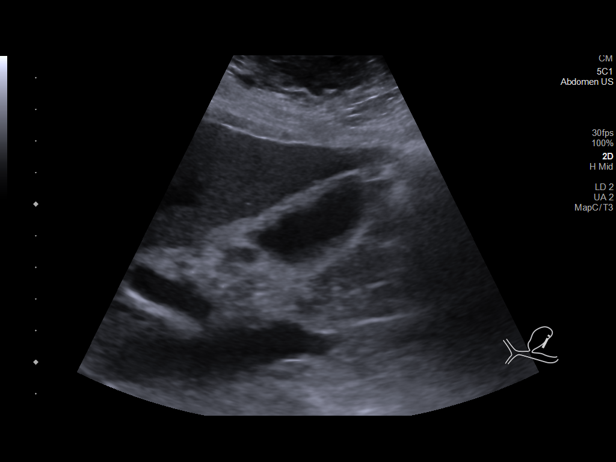
[im 5/58]
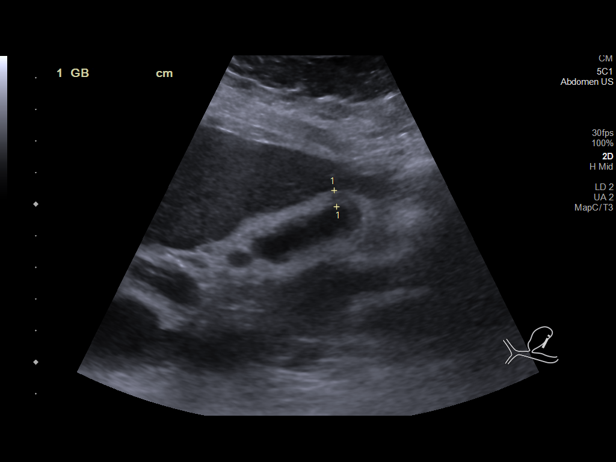
[im 10/58]
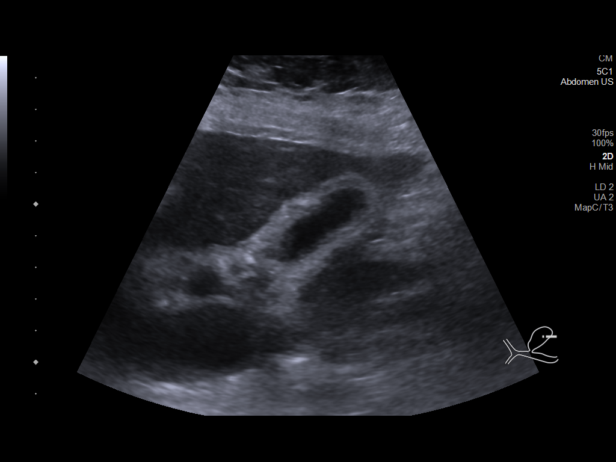
[im 12/58]
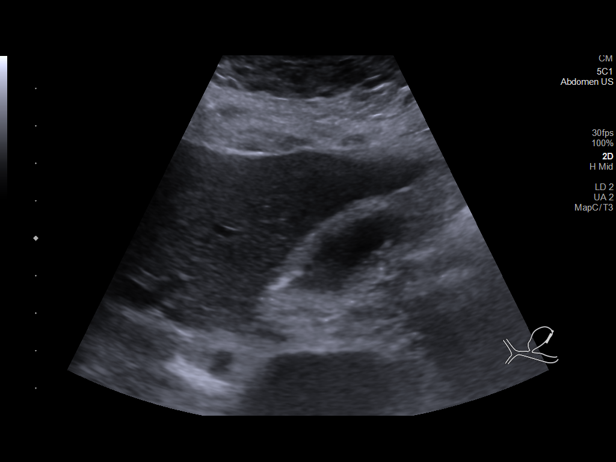
[im 17/58]
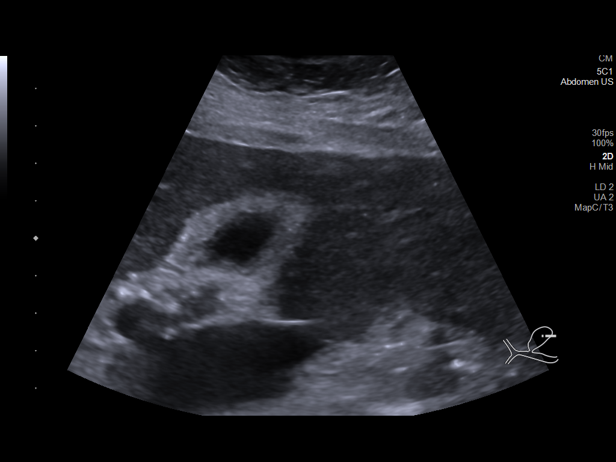
[im 22/58]
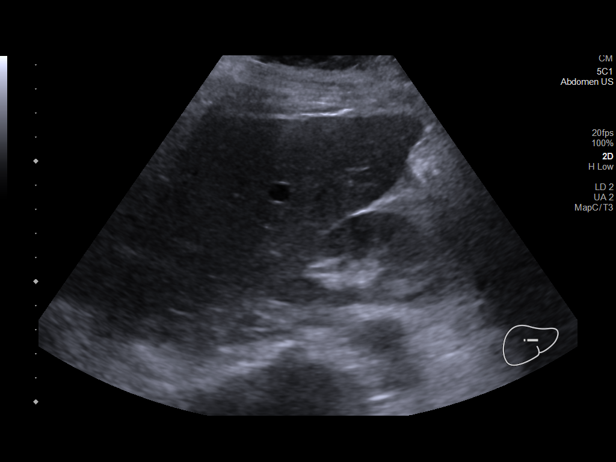
[im 24/58]
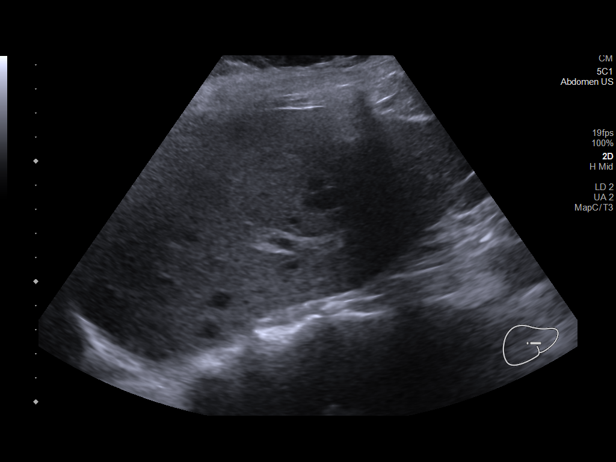
[im 29/58]
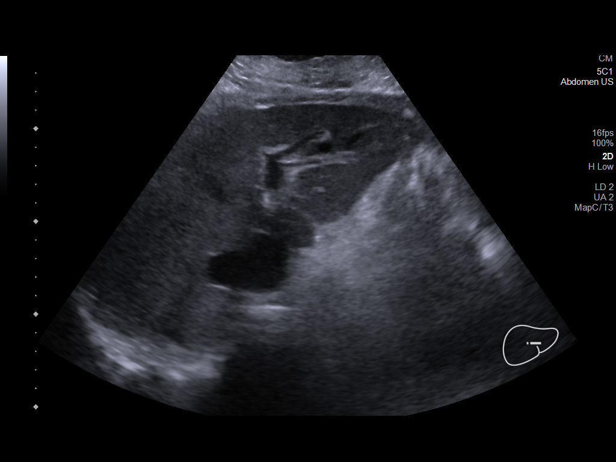
[im 34/58]
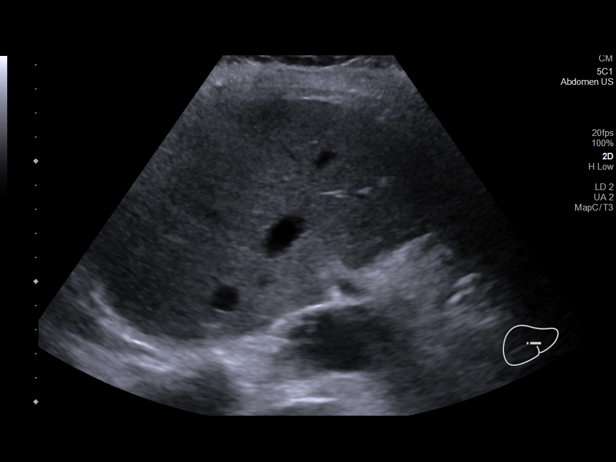
[im 36/58]
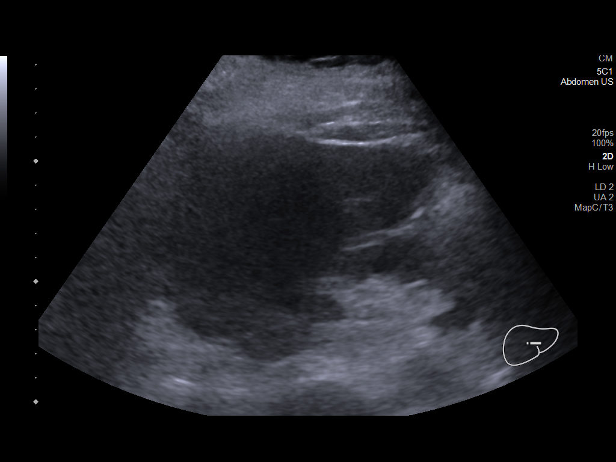
[im 41/58]
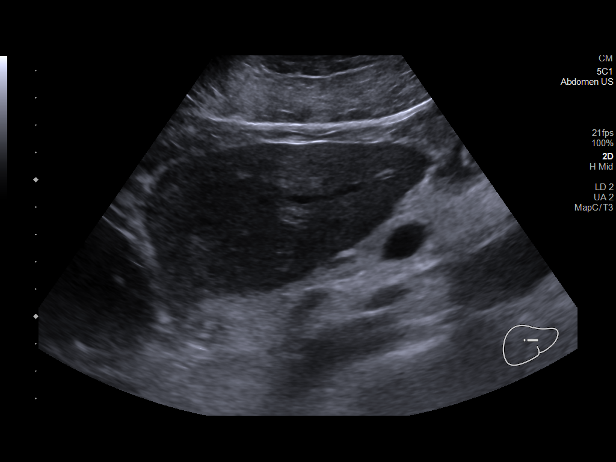
[im 46/58]
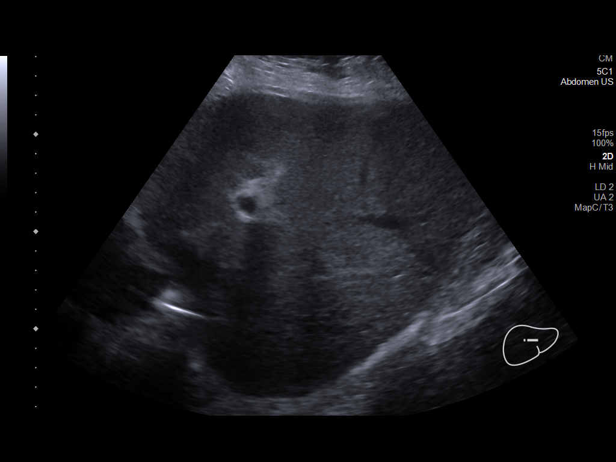
[im 48/58]
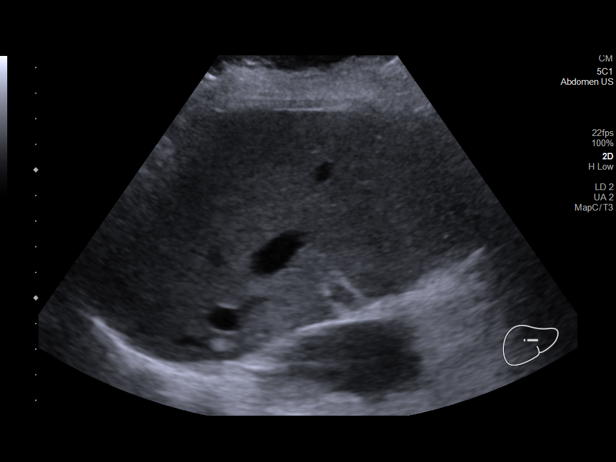
[im 53/58]
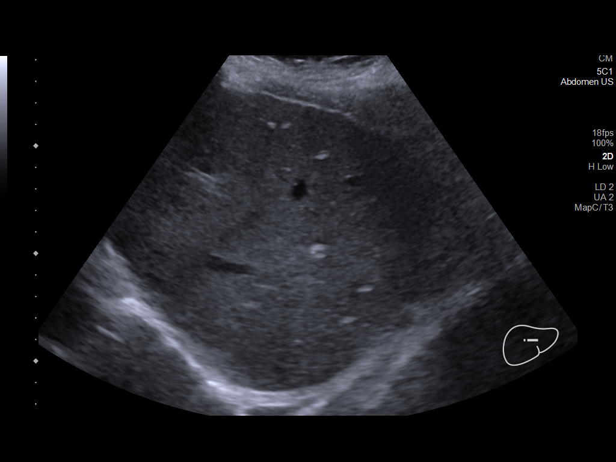
[im 58/58]
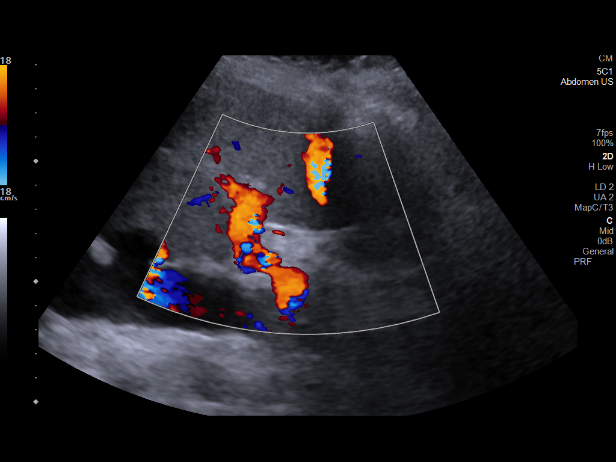

[15 of 25 positions shown; findings below may reference images not displayed]

FINDINGS: ULTRASOUND ABDOMEN LIMITED RIGHT UPPER QUADRANT

Gallbladder:

No gallstones or wall thickening visualized. No sonographic Murphy
sign noted.

Common bile duct:

Diameter: Normal caliber, 5 mm

Liver:

Heterogeneous echotexture. No mass or biliary ductal dilatation.
Portal vein is patent on color Doppler imaging with normal direction
of blood flow towards the liver.

ULTRASOUND HEPATIC ELASTOGRAPHY

Device: Siemens Helix VTQ

Patient position: Supine

Transducer 6C1

Number of measurements: 10

Hepatic segment:  8

Median kPa: 16

IQR:

IQR/Median kPa ratio:

Data quality:  Good

Diagnostic category:  >13 kPa: highly suggestive of cACLD

The use of hepatic elastography is applicable to patients with viral
hepatitis and non-alcoholic fatty liver disease. At this time, there
is insufficient data for the referenced cut-off values and use in
other causes of liver disease, including alcoholic liver disease.
Patients, however, may be assessed by elastography and serve as
their own reference standard/baseline.

In patients with non-alcoholic liver disease, the values suggesting
compensated advanced chronic liver disease (cACLD) may be lower, and
patients may need additional testing with elasticity results of [DATE]
kPa.

Please note that abnormal hepatic elasticity and shear wave
velocities may also be identified in clinical settings other than
with hepatic fibrosis, such as: acute hepatitis, elevated right
heart and central venous pressures including use of beta blockers,
Frank-Tore disease (Major), infiltrative processes such as
mastocytosis/amyloidosis/infiltrative tumor/lymphoma, extrahepatic
cholestasis, with hyperemia in the post-prandial state, and with
liver transplantation. Correlation with patient history, laboratory
data, and clinical condition recommended.

Diagnostic Categories:

< or =5 kPa: high probability of being normal

< or =9 kPa: in the absence of other known clinical signs, rules [DATE] kPa and ?13 kPa: suggestive of cACLD, but needs further testing

>13 kPa: highly suggestive of cACLD

> or =17 kPa: highly suggestive of cACLD with an increased
probability of clinically significant portal hypertension
IMPRESSION: ULTRASOUND RUQ:

Heterogeneous echotexture throughout the liver. No focal
abnormality.

ULTRASOUND HEPATIC ELASTOGRAPHY:

Median kPa:

Diagnostic category:  >13 kPa: highly suggestive of cACLD

ADDENDUM:
Gallbladder wall is thickened, measuring 5 mm. This likely is
related to partially contracted state and liver disease. Negative
sonographic Murphy's sign.

*** End of Addendum ***
FINDINGS: ULTRASOUND ABDOMEN LIMITED RIGHT UPPER QUADRANT

Gallbladder:

No gallstones or wall thickening visualized. No sonographic Murphy
sign noted.

Common bile duct:

Diameter: Normal caliber, 5 mm

Liver:

Heterogeneous echotexture. No mass or biliary ductal dilatation.
Portal vein is patent on color Doppler imaging with normal direction
of blood flow towards the liver.

ULTRASOUND HEPATIC ELASTOGRAPHY

Device: Siemens Helix VTQ

Patient position: Supine

Transducer 6C1

Number of measurements: 10

Hepatic segment:  8

Median kPa: 16

IQR:

IQR/Median kPa ratio:

Data quality:  Good

Diagnostic category:  >13 kPa: highly suggestive of cACLD

The use of hepatic elastography is applicable to patients with viral
hepatitis and non-alcoholic fatty liver disease. At this time, there
is insufficient data for the referenced cut-off values and use in
other causes of liver disease, including alcoholic liver disease.
Patients, however, may be assessed by elastography and serve as
their own reference standard/baseline.

In patients with non-alcoholic liver disease, the values suggesting
compensated advanced chronic liver disease (cACLD) may be lower, and
patients may need additional testing with elasticity results of [DATE]
kPa.

Please note that abnormal hepatic elasticity and shear wave
velocities may also be identified in clinical settings other than
with hepatic fibrosis, such as: acute hepatitis, elevated right
heart and central venous pressures including use of beta blockers,
Frank-Tore disease (Major), infiltrative processes such as
mastocytosis/amyloidosis/infiltrative tumor/lymphoma, extrahepatic
cholestasis, with hyperemia in the post-prandial state, and with
liver transplantation. Correlation with patient history, laboratory
data, and clinical condition recommended.

Diagnostic Categories:

< or =5 kPa: high probability of being normal

< or =9 kPa: in the absence of other known clinical signs, rules [DATE] kPa and ?13 kPa: suggestive of cACLD, but needs further testing

>13 kPa: highly suggestive of cACLD

> or =17 kPa: highly suggestive of cACLD with an increased
probability of clinically significant portal hypertension
IMPRESSION: ULTRASOUND RUQ:

Heterogeneous echotexture throughout the liver. No focal
abnormality.

ULTRASOUND HEPATIC ELASTOGRAPHY:

Median kPa:

Diagnostic category:  >13 kPa: highly suggestive of cACLD

## 2022-08-11 IMAGING — US US LIVER ELASTOGRAPHY
1 series · 12 of 12 positions shown · non-contrast
Comparison: None.
COMPARISON: None.

Addendum:
CLINICAL DATA: Upper abdominal pain.

EXAM:
US ABDOMEN LIMITED - RIGHT UPPER QUADRANT
ULTRASOUND HEPATIC ELASTOGRAPHY
TECHNIQUE: Sonography of the right upper quadrant was performed. In addition,
ultrasound elastography evaluation of the liver was performed. A
region of interest was placed within the right lobe of the liver.
Following application of a compressive sonographic pulse, tissue
compressibility was assessed. Multiple assessments were performed at
the selected site. Median tissue compressibility was determined.
Previously, hepatic stiffness was assessed by shear wave velocity.
Based on recently published [employer] in [employer]
consensus article, reporting is now recommended to be performed in
the SI units of pressure (kiloPascals) representing hepatic
stiffness/elasticity. The obtained result is compared to the
published reference standards. (cACLD = compensated Advanced Chronic
Liver Disease)

[Series 1: us elastography liver · 12 of 12 slices shown]
[im 1/12]
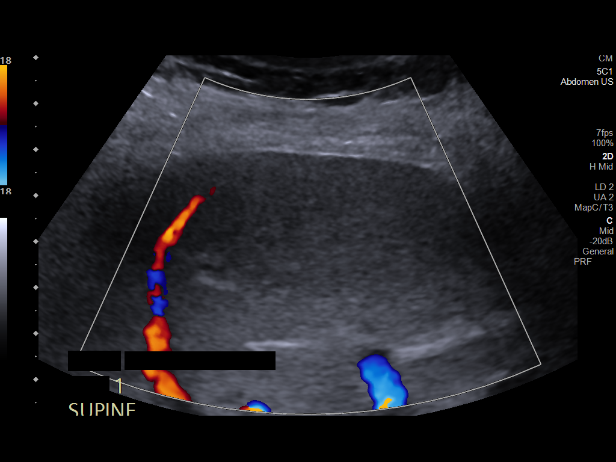
[im 2/12]
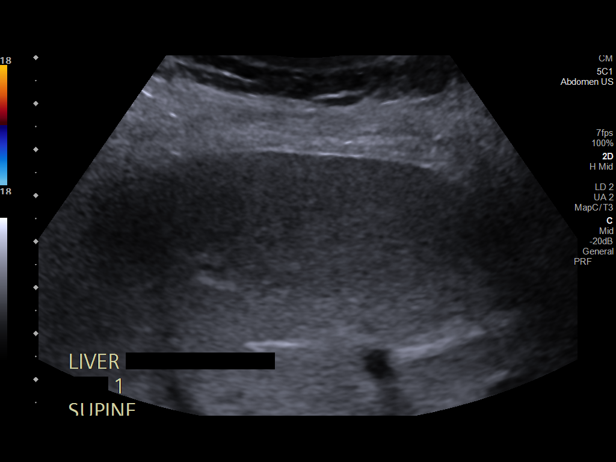
[im 3/12]
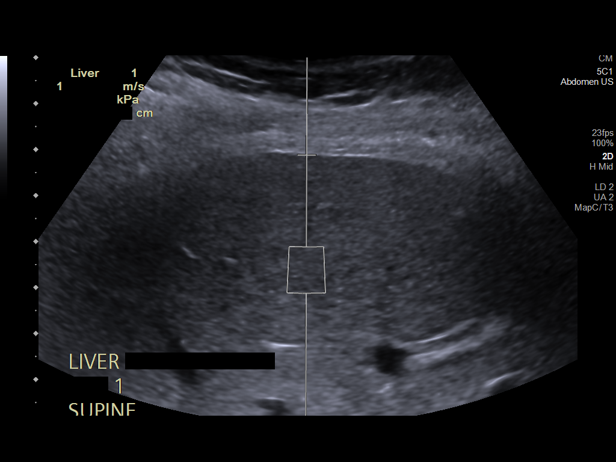
[im 4/12]
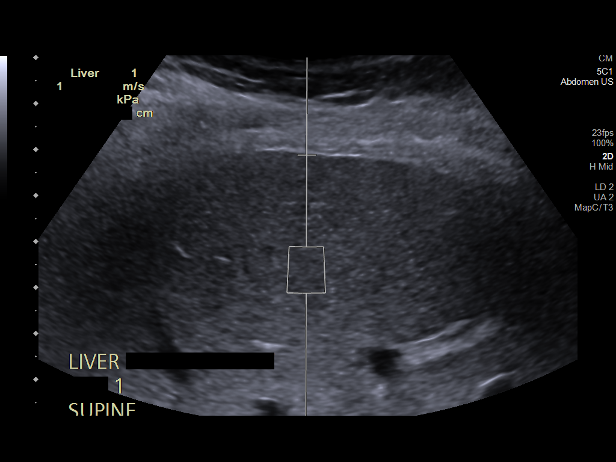
[im 5/12]
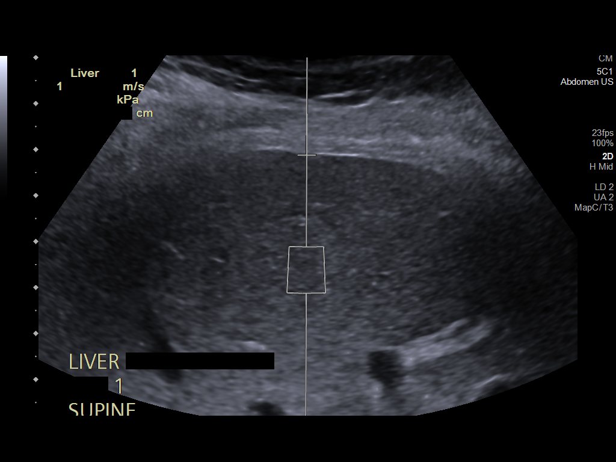
[im 6/12]
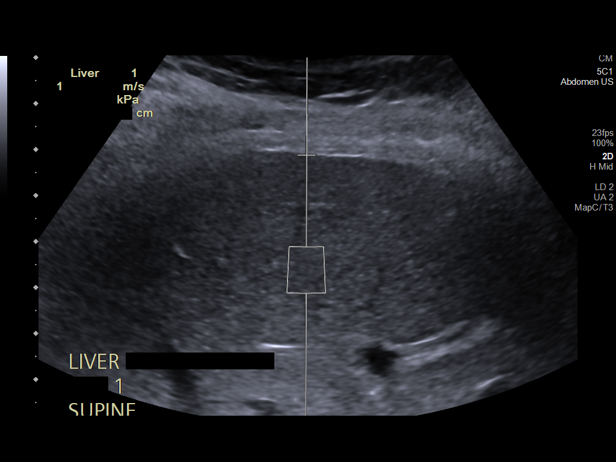
[im 7/12]
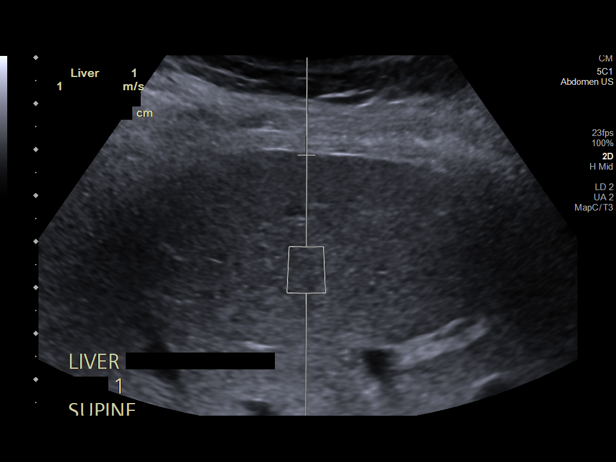
[im 8/12]
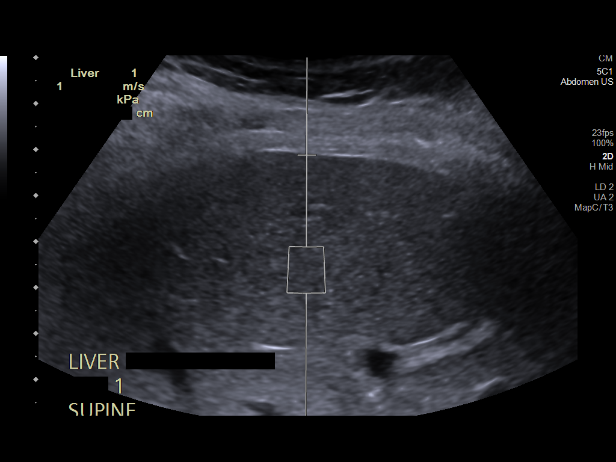
[im 9/12]
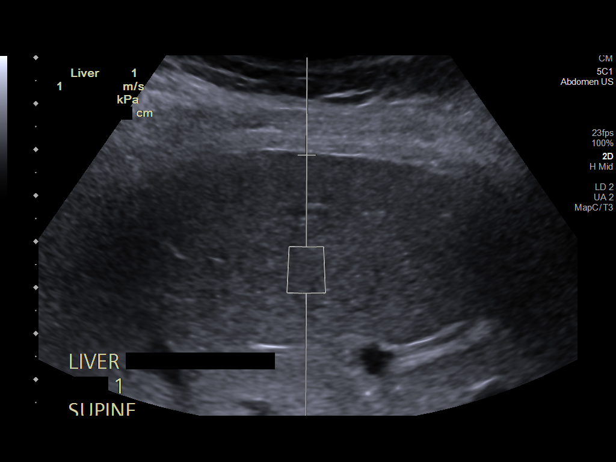
[im 10/12]
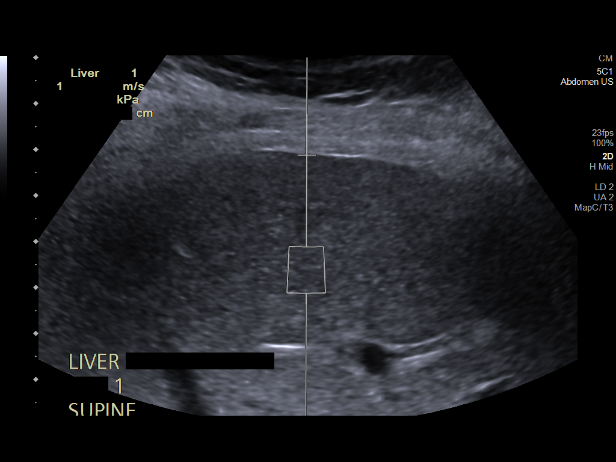
[im 11/12]
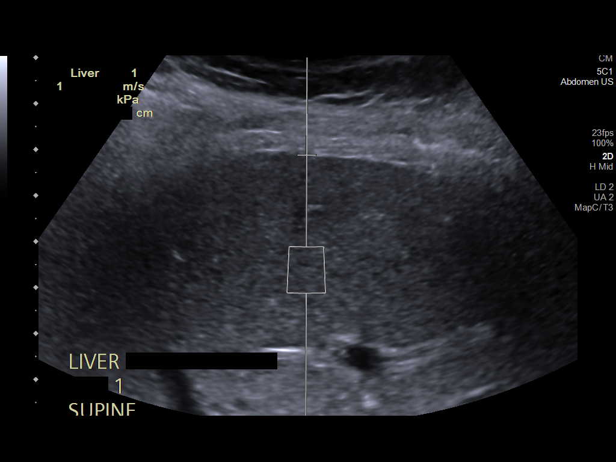
[im 12/12]
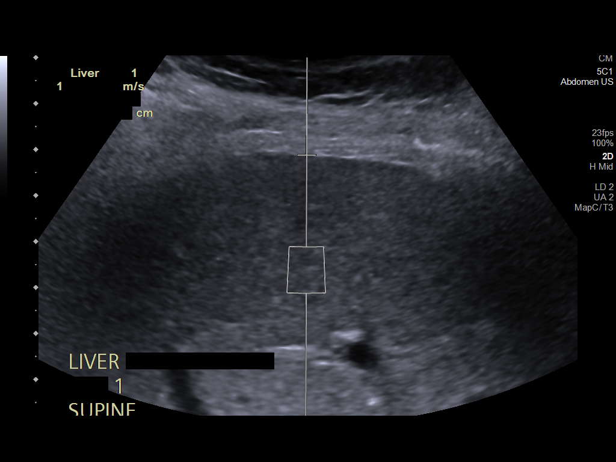

[12 of 12 positions shown; findings below may reference images not displayed]

FINDINGS: ULTRASOUND ABDOMEN LIMITED RIGHT UPPER QUADRANT

Gallbladder:

No gallstones or wall thickening visualized. No sonographic Murphy
sign noted.

Common bile duct:

Diameter: Normal caliber, 5 mm

Liver:

Heterogeneous echotexture. No mass or biliary ductal dilatation.
Portal vein is patent on color Doppler imaging with normal direction
of blood flow towards the liver.

ULTRASOUND HEPATIC ELASTOGRAPHY

Device: Siemens Helix VTQ

Patient position: Supine

Transducer [id number]

Number of measurements: 10

Hepatic segment:  8

Median kPa: 16

IQR:

IQR/Median kPa ratio:

Data quality:  Good

Diagnostic category:  >13 kPa: highly suggestive of cACLD

The use of hepatic elastography is applicable to patients with viral
hepatitis and non-alcoholic fatty liver disease. At this time, there
is insufficient data for the referenced cut-off values and use in
other causes of liver disease, including alcoholic liver disease.
Patients, however, may be assessed by elastography and serve as
their own reference standard/baseline.

In patients with non-alcoholic liver disease, the values suggesting
compensated advanced chronic liver disease (cACLD) may be lower, and
patients may need additional testing with elasticity results of [DATE]
kPa.

Please note that abnormal hepatic elasticity and shear wave
velocities may also be identified in clinical settings other than
with hepatic fibrosis, such as: acute hepatitis, elevated right
heart and central venous pressures including use of beta blockers,
Frank-Tore disease (Major), infiltrative processes such as
mastocytosis/amyloidosis/infiltrative tumor/lymphoma, extrahepatic
cholestasis, with hyperemia in the post-prandial state, and with
liver transplantation. Correlation with patient history, laboratory
data, and clinical condition recommended.

Diagnostic Categories:

< or =5 kPa: high probability of being normal

< or =9 kPa: in the absence of other known clinical signs, rules [DATE] kPa and ?13 kPa: suggestive of cACLD, but needs further testing

>13 kPa: highly suggestive of cACLD

> or =17 kPa: highly suggestive of cACLD with an increased
probability of clinically significant portal hypertension
IMPRESSION: ULTRASOUND RUQ:

Heterogeneous echotexture throughout the liver. No focal
abnormality.

ULTRASOUND HEPATIC ELASTOGRAPHY:

Median kPa:

Diagnostic category:  >13 kPa: highly suggestive of cACLD

ADDENDUM:
Gallbladder wall is thickened, measuring 5 mm. This likely is
related to partially contracted state and liver disease. Negative
sonographic Murphy's sign.

*** End of Addendum ***
FINDINGS: ULTRASOUND ABDOMEN LIMITED RIGHT UPPER QUADRANT

Gallbladder:

No gallstones or wall thickening visualized. No sonographic Murphy
sign noted.

Common bile duct:

Diameter: Normal caliber, 5 mm

Liver:

Heterogeneous echotexture. No mass or biliary ductal dilatation.
Portal vein is patent on color Doppler imaging with normal direction
of blood flow towards the liver.

ULTRASOUND HEPATIC ELASTOGRAPHY

Device: Siemens Helix VTQ

Patient position: Supine

Transducer [id number]

Number of measurements: 10

Hepatic segment:  8

Median kPa: 16

IQR:

IQR/Median kPa ratio:

Data quality:  Good

Diagnostic category:  >13 kPa: highly suggestive of cACLD

The use of hepatic elastography is applicable to patients with viral
hepatitis and non-alcoholic fatty liver disease. At this time, there
is insufficient data for the referenced cut-off values and use in
other causes of liver disease, including alcoholic liver disease.
Patients, however, may be assessed by elastography and serve as
their own reference standard/baseline.

In patients with non-alcoholic liver disease, the values suggesting
compensated advanced chronic liver disease (cACLD) may be lower, and
patients may need additional testing with elasticity results of [DATE]
kPa.

Please note that abnormal hepatic elasticity and shear wave
velocities may also be identified in clinical settings other than
with hepatic fibrosis, such as: acute hepatitis, elevated right
heart and central venous pressures including use of beta blockers,
Frank-Tore disease (Major), infiltrative processes such as
mastocytosis/amyloidosis/infiltrative tumor/lymphoma, extrahepatic
cholestasis, with hyperemia in the post-prandial state, and with
liver transplantation. Correlation with patient history, laboratory
data, and clinical condition recommended.

Diagnostic Categories:

< or =5 kPa: high probability of being normal

< or =9 kPa: in the absence of other known clinical signs, rules [DATE] kPa and ?13 kPa: suggestive of cACLD, but needs further testing

>13 kPa: highly suggestive of cACLD

> or =17 kPa: highly suggestive of cACLD with an increased
probability of clinically significant portal hypertension
IMPRESSION: ULTRASOUND RUQ:

Heterogeneous echotexture throughout the liver. No focal
abnormality.

ULTRASOUND HEPATIC ELASTOGRAPHY:

Median kPa:

Diagnostic category:  >13 kPa: highly suggestive of cACLD
